# Patient Record
Sex: Male | Born: 1940 | Race: White | Hispanic: No | Marital: Married
Health system: Southern US, Community
[De-identification: ages and names within clinical notes are randomized; demographics above are authoritative.]

---

## 2012-02-15 ENCOUNTER — Emergency Department: Payer: Self-pay | Admitting: Emergency Medicine

## 2012-02-15 LAB — CBC
MCHC: 33.3 g/dL (ref 32.0–36.0)
Platelet: 213 10*3/uL (ref 150–440)
RDW: 13.8 % (ref 11.5–14.5)
WBC: 11 10*3/uL — ABNORMAL HIGH (ref 3.8–10.6)

## 2012-02-15 LAB — URINALYSIS, COMPLETE
Glucose,UR: NEGATIVE mg/dL (ref 0–75)
Protein: 30
RBC,UR: 120 /HPF (ref 0–5)

## 2012-02-15 LAB — COMPREHENSIVE METABOLIC PANEL
Alkaline Phosphatase: 59 U/L (ref 50–136)
Anion Gap: 7 (ref 7–16)
BUN: 19 mg/dL — ABNORMAL HIGH (ref 7–18)
Bilirubin,Total: 0.5 mg/dL (ref 0.2–1.0)
Co2: 24 mmol/L (ref 21–32)
Creatinine: 1.07 mg/dL (ref 0.60–1.30)
EGFR (African American): >60
EGFR (Non-African Amer.): >60
Osmolality: 275 (ref 275–301)
Potassium: 4.2 mmol/L (ref 3.5–5.1)
SGOT(AST): 24 U/L (ref 15–37)

## 2012-02-17 ENCOUNTER — Emergency Department: Payer: Self-pay | Admitting: Emergency Medicine

## 2012-02-17 LAB — BASIC METABOLIC PANEL
Calcium, Total: 9.6 mg/dL (ref 8.5–10.1)
Chloride: 106 mmol/L (ref 98–107)
Creatinine: 1.45 mg/dL — ABNORMAL HIGH (ref 0.60–1.30)
EGFR (African American): 56 — ABNORMAL LOW
EGFR (Non-African Amer.): 48 — ABNORMAL LOW
Glucose: 164 mg/dL — ABNORMAL HIGH (ref 65–99)
Potassium: 4 mmol/L (ref 3.5–5.1)
Sodium: 139 mmol/L (ref 136–145)

## 2012-02-17 LAB — CBC
HGB: 13.8 g/dL (ref 13.0–18.0)
MCH: 32.9 pg (ref 26.0–34.0)
RBC: 4.19 10*6/uL — ABNORMAL LOW (ref 4.40–5.90)
WBC: 10.1 10*3/uL (ref 3.8–10.6)

## 2012-02-17 LAB — URINALYSIS, COMPLETE
Bilirubin,UR: NEGATIVE
Blood: NEGATIVE
Glucose,UR: 50 mg/dL (ref 0–75)
Leukocyte Esterase: NEGATIVE
Nitrite: NEGATIVE
Ph: 9 (ref 4.5–8.0)
Protein: 30
Specific Gravity: 1.021 (ref 1.003–1.030)
WBC UR: 3 /HPF (ref 0–5)

## 2012-02-19 ENCOUNTER — Inpatient Hospital Stay: Payer: Self-pay | Admitting: Internal Medicine

## 2012-02-19 LAB — CK TOTAL AND CKMB (NOT AT ARMC)
CK, Total: 289 U/L — ABNORMAL HIGH (ref 35–232)
CK-MB: 6.8 ng/mL — ABNORMAL HIGH (ref 0.5–3.6)

## 2012-02-19 LAB — PRO B NATRIURETIC PEPTIDE: B-Type Natriuretic Peptide: 5934 pg/mL — ABNORMAL HIGH (ref 0–125)

## 2012-02-19 LAB — TROPONIN I: Troponin-I: 2.4 ng/mL — ABNORMAL HIGH

## 2012-02-19 LAB — COMPREHENSIVE METABOLIC PANEL
Albumin: 3.2 g/dL — ABNORMAL LOW (ref 3.4–5.0)
Anion Gap: 12 (ref 7–16)
Chloride: 102 mmol/L (ref 98–107)
Co2: 20 mmol/L — ABNORMAL LOW (ref 21–32)
EGFR (Non-African Amer.): 47 — ABNORMAL LOW
Osmolality: 281 (ref 275–301)
Potassium: 3.8 mmol/L (ref 3.5–5.1)
SGOT(AST): 41 U/L — ABNORMAL HIGH (ref 15–37)
Sodium: 134 mmol/L — ABNORMAL LOW (ref 136–145)

## 2012-02-19 LAB — CBC
HCT: 36.9 % — ABNORMAL LOW (ref 40.0–52.0)
HGB: 12.7 g/dL — ABNORMAL LOW (ref 13.0–18.0)
MCV: 96 fL (ref 80–100)
Platelet: 178 10*3/uL (ref 150–440)
RBC: 3.85 10*6/uL — ABNORMAL LOW (ref 4.40–5.90)

## 2012-02-19 LAB — PROTIME-INR
INR: 1.1
Prothrombin Time: 14.2 secs (ref 11.5–14.7)

## 2012-02-19 LAB — APTT: Activated PTT: 32.6 secs (ref 23.6–35.9)

## 2012-03-01 DEATH — deceased

## 2013-06-27 IMAGING — CT CT STONE STUDY
1 of 2 series · 15 of 32 positions shown, 19 images · non-contrast
Comparison: none

REASON FOR EXAM: right flank pain eval stone
COMMENTS:

PROCEDURE:     CT  - CT ABDOMEN /PELVIS WO (STONE)  - February 15, 2012 [DATE]
RESULT:     CT abdomen and pelvis dated 02/15/2012
TECHNIQUE: Helical noncontrasted 3 mm sections were obtained from the lung
bases to the pubic symphysis. Hypoventilation is appreciated within the lung
bases.

[Series 2: 3mm soft tissue · axial · 0.70mm/px · z∈[-1078,-622]mm · 15 of 168 slices shown, 19 images]
[im 8/168  soft-tissue]
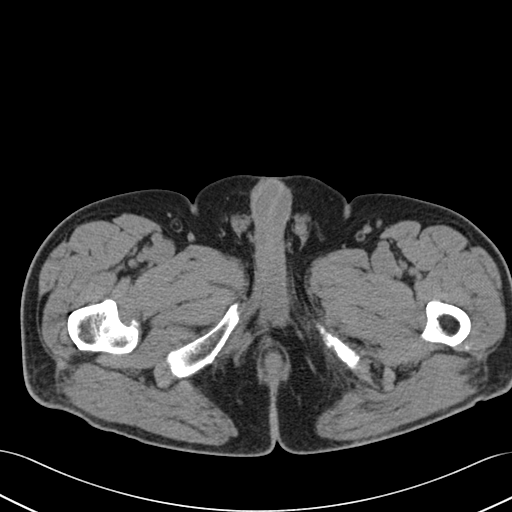
[im 8/168  bone]
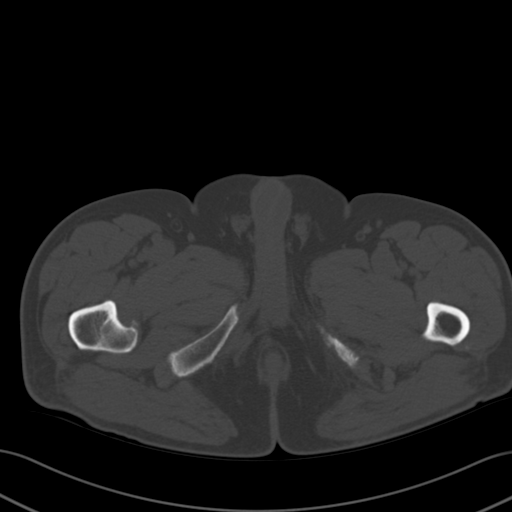
[im 22/168  soft-tissue]
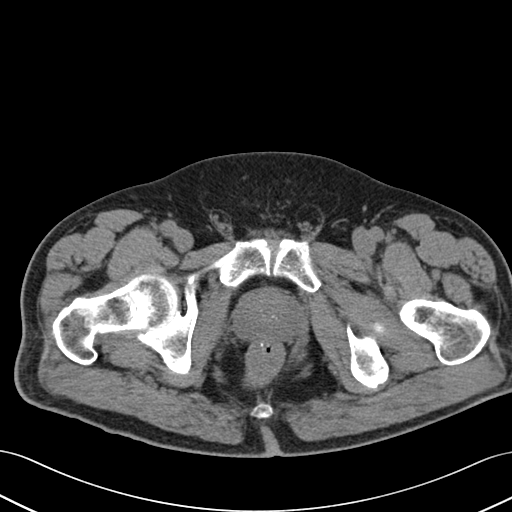
[im 37/168  soft-tissue]
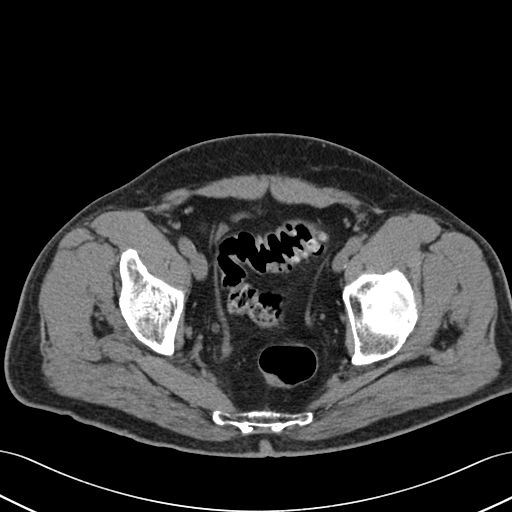
[im 44/168  soft-tissue]
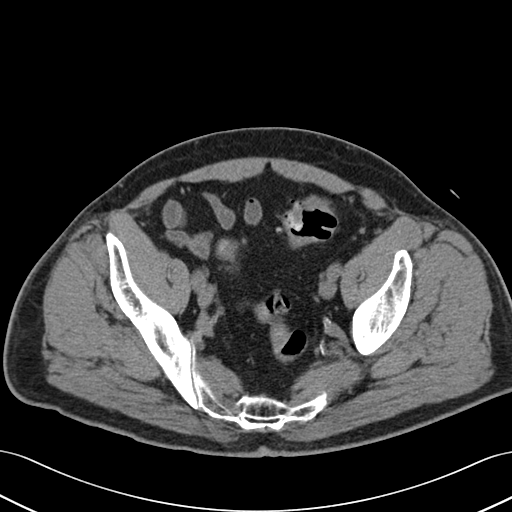
[im 59/168  soft-tissue]
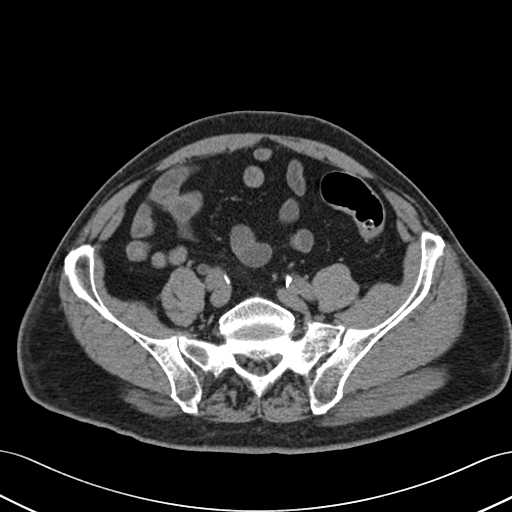
[im 73/168  soft-tissue]
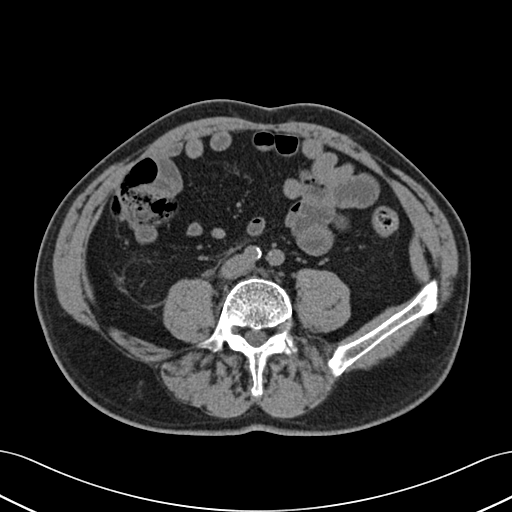
[im 88/168  soft-tissue]
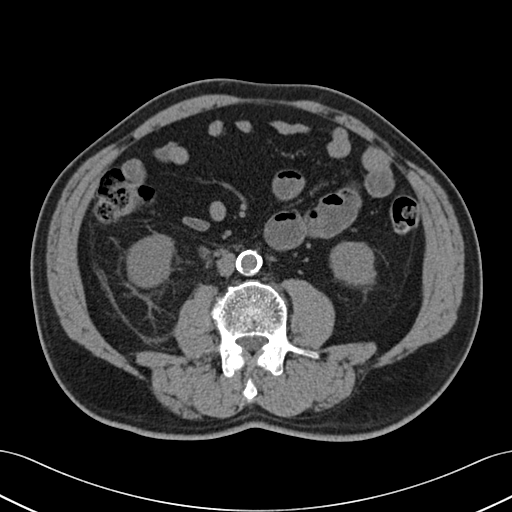
[im 95/168  soft-tissue]
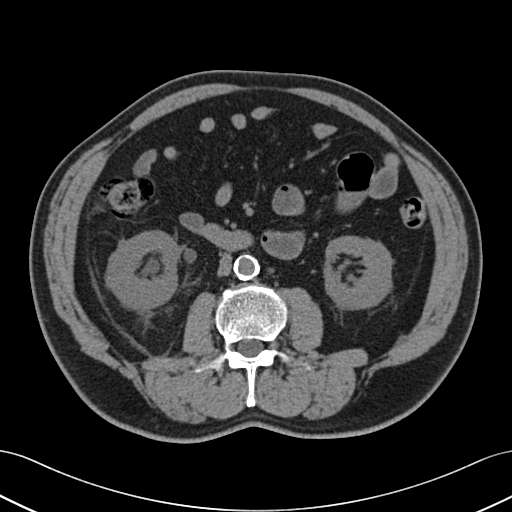
[im 109/168  soft-tissue]
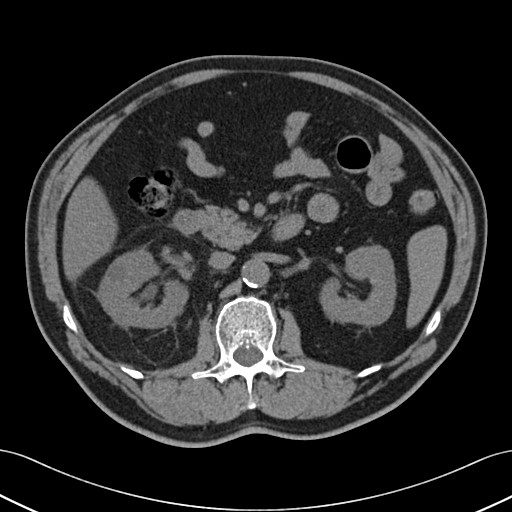
[im 109/168  bone]
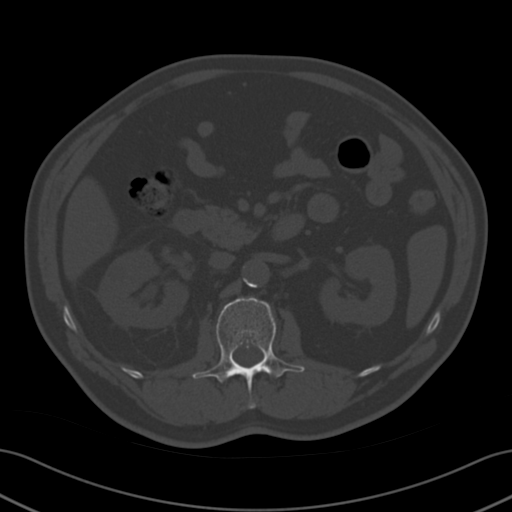
[im 124/168  soft-tissue]
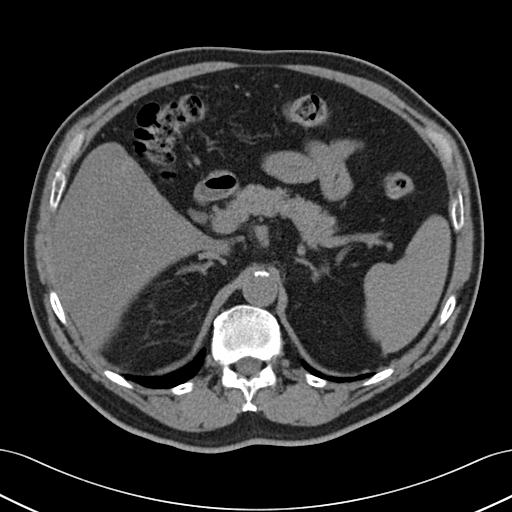
[im 131/168  soft-tissue]
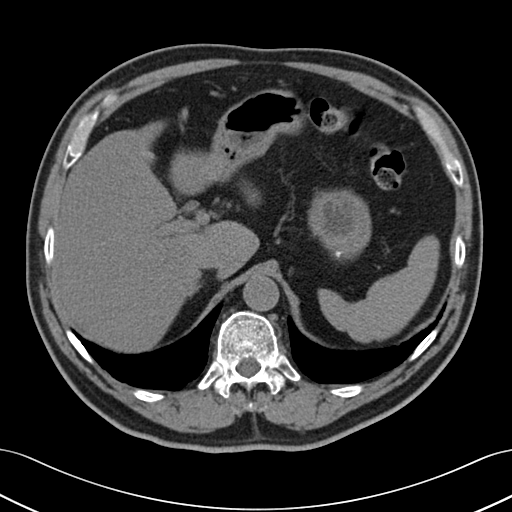
[im 138/168  lung]
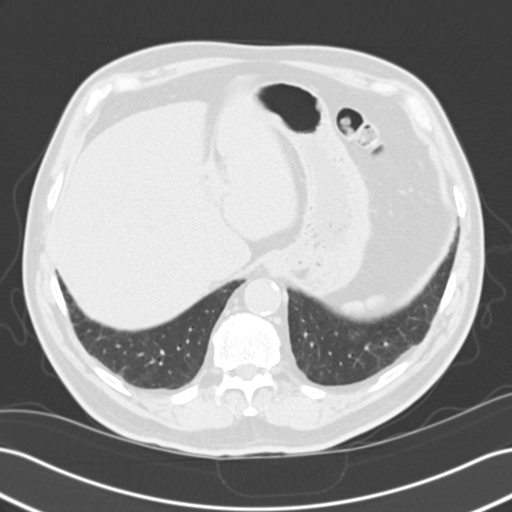
[im 146/168  soft-tissue]
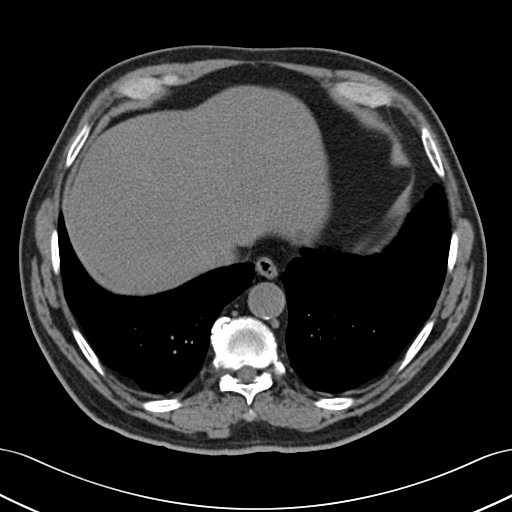
[im 146/168  lung]
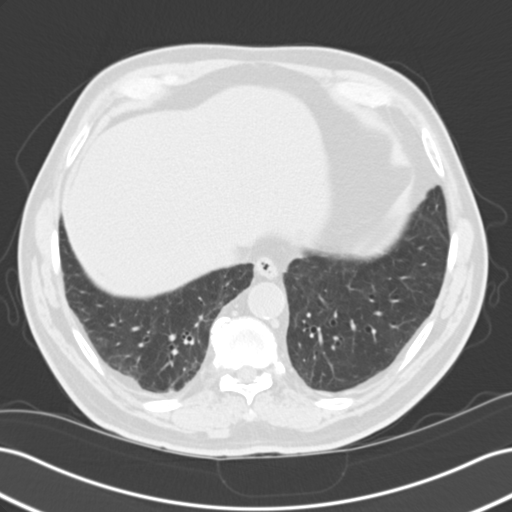
[im 153/168  lung]
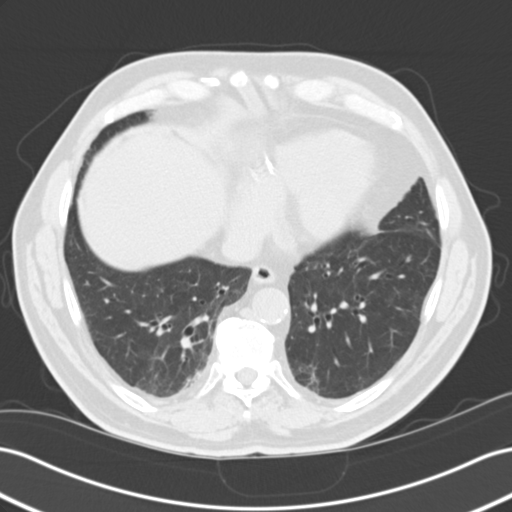
[im 160/168  soft-tissue]
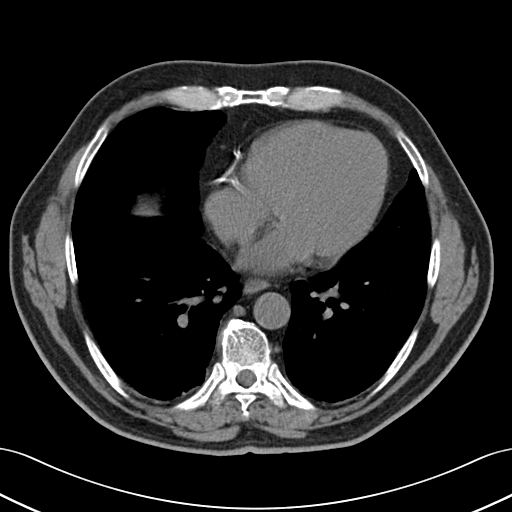
[im 160/168  lung]
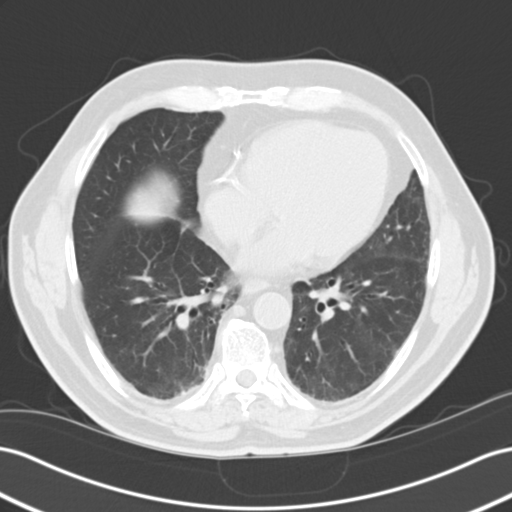

[15 of 32 positions shown; findings below may reference images not displayed]

Noncontrast evaluation of the liver, spleen, adrenals, pancreas is
unremarkable. Patient status post cholecystectomy.

Evaluation of the right kidney demonstrates mild stranding within the
perinephric fat. There is mild hydronephrosis secondary to a 3.5 millimeter
calculus within the distal left ureter. Mild hydroureter is appreciated. The
left kidney demonstrates a small nonobstructing medullary calculus without
evidence for hydronephrosis, hydroureter, nephrolithiasis nor
ureterolithiasis. There is no evidence of bowel obstruction, enteritis,
colitis nor diverticulitis. Diverticulosis is appreciated within the sigmoid
colon.
IMPRESSION: Mild right obstructive uropathy secondary to a distal
ureteral calculus common 3.5 mm.
2. Diverticulosis sigmoid colon
3. Dr. Tiger of the emergency department was informed of these findings
via a preliminary faxed report.

## 2013-07-01 IMAGING — CR DG CHEST 2V
1 series · 2 of 2 positions shown · non-contrast
Comparison: none

REASON FOR EXAM: Shortness of Breath
COMMENTS:   May transport without cardiac monitor

[Series 1: w chest pa · 0.14mm/px · 2 of 2 slices shown]
[im 1/2]
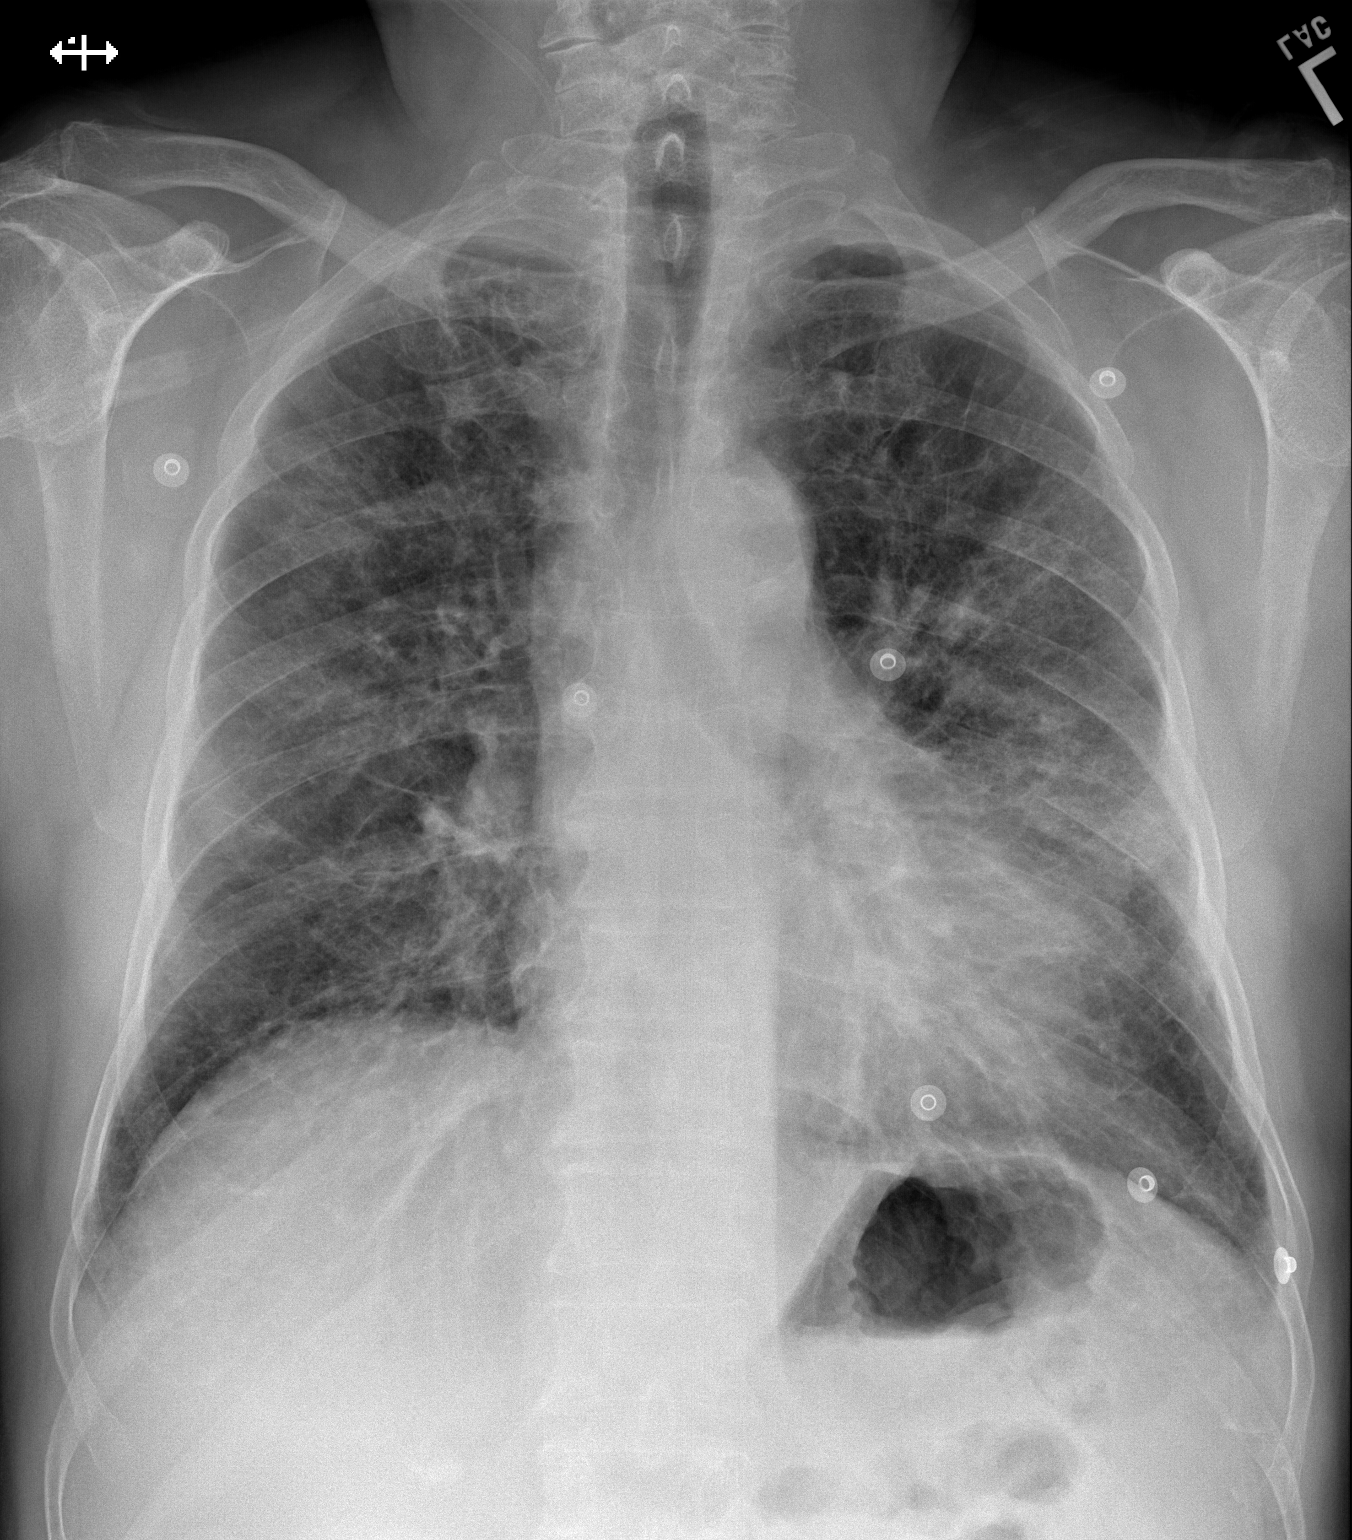
[im 2/2]
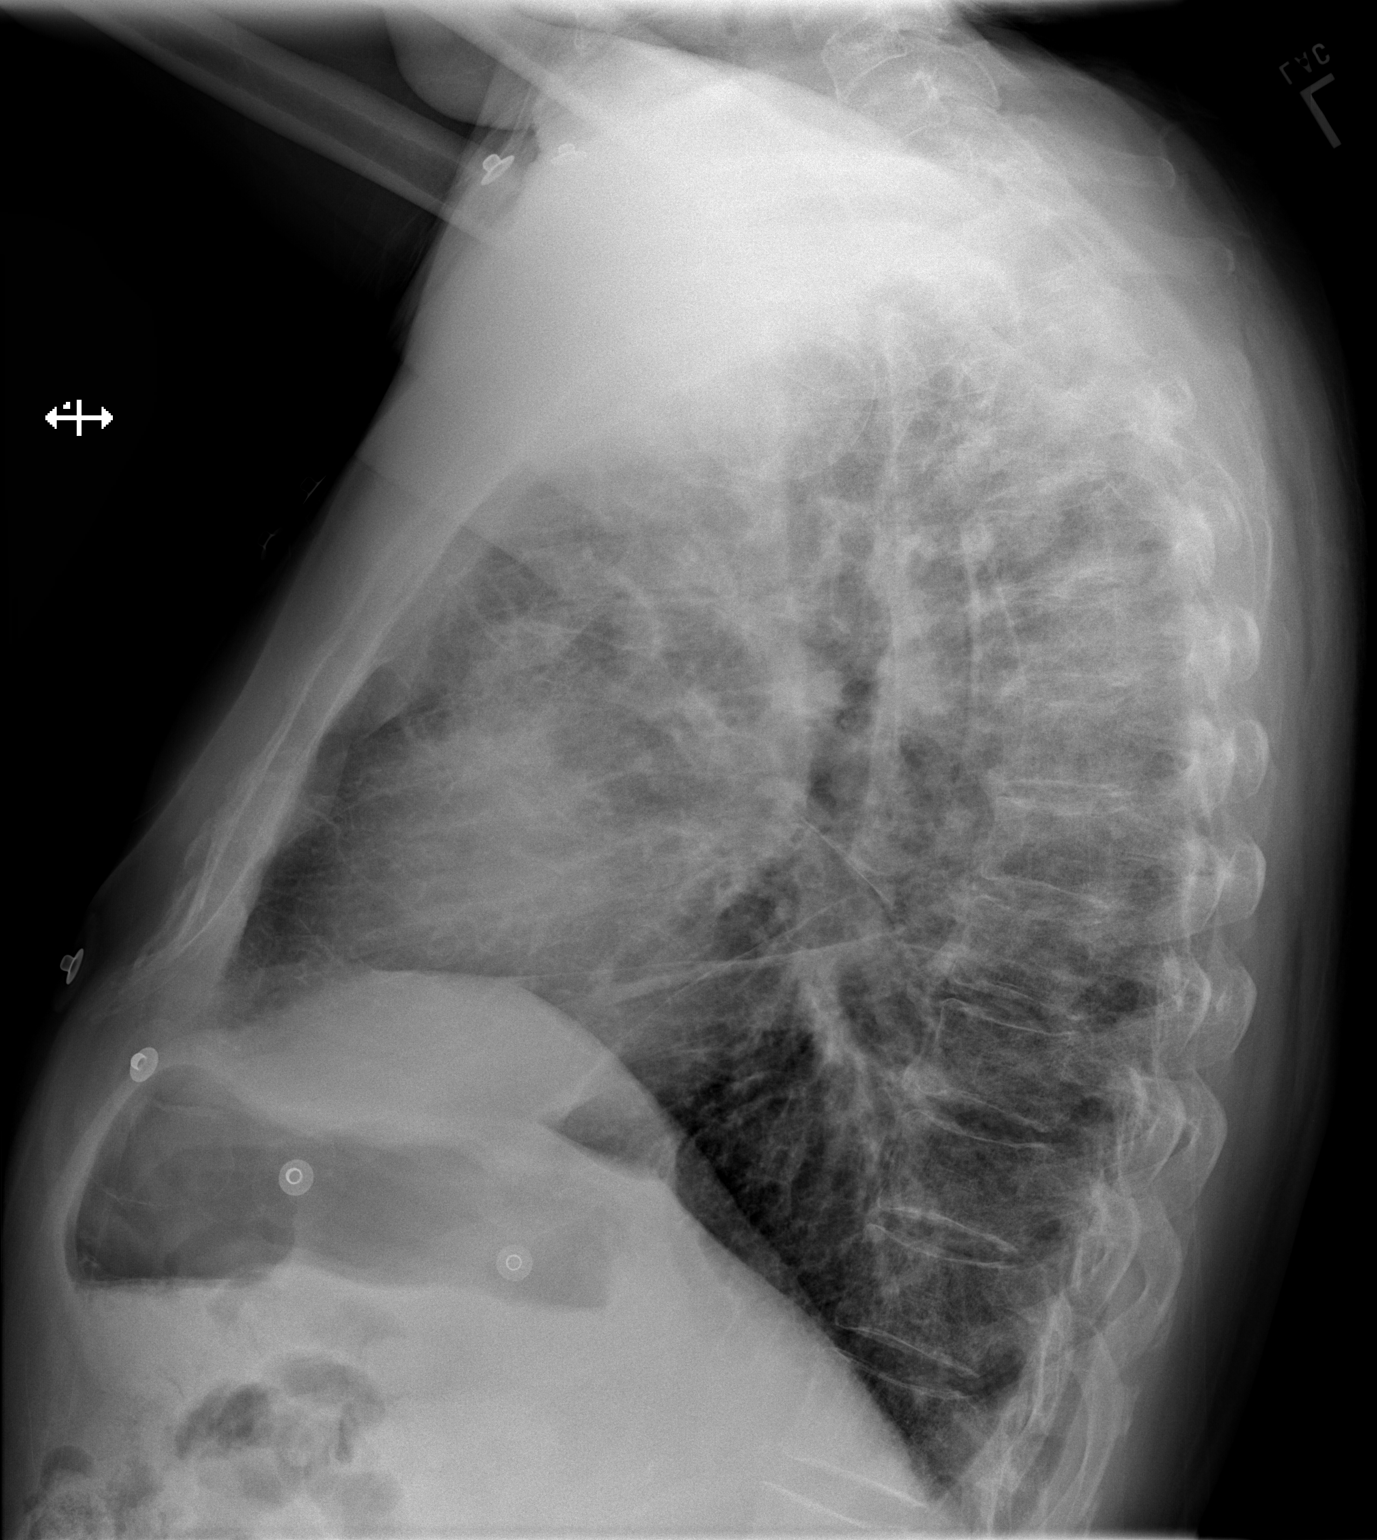

[2 of 2 positions shown; findings below may reference images not displayed]

PROCEDURE:     DXR - DXR CHEST PA (OR AP) AND LATERAL  - February 19, 2012  [DATE]

RESULT:     The lungs are abnormal with diffuse heterogeneous interstitial
density. There is some atelectasis in the lingula with poor visualization of
the left heart border. There is no significant effusion or pneumothorax.
Differential considerations diffuse bilateral pneumonia, asymmetric
heterogeneous edema or metastatic disease. The heart is mildly enlarged.
IMPRESSION: Please see above.

[REDACTED]

## 2014-04-23 NOTE — Consult Note (Signed)
PATIENT NAME:  Billy Cook, Billy Cook MR#:  [id number] DATE OF BIRTH:  [birth year]  DATE OF CONSULTATION:  02/19/2012  REFERRING PHYSICIAN:   CONSULTING PHYSICIAN:  Laurier NancyShaukat A. Koren Plyler, MD  HISTORY: This is a 74 year old white male with a past medical history of hypertension, diabetes and hypercholesterolemia. Had a stress test several years ago that was negative, in my office, but never came for followup. Has been having chest pain for the past 2 to 3 days, on and off. This morning he woke up with burning-type pain in the chest associated with diaphoresis and shortness of breath. His breathing was labored. His wife called 911 and then came to the Emergency Room. He was given nitroglycerin twice and then chest pain went away. His EKG had minor ST elevation thus I was asked to evaluate the patient. He denies any chest pain right now. He says this has been going on for 2 to 3 days, off and on.  PAST MEDICAL HISTORY: History of hypertension, hyperlipidemia and diabetes.   SOCIAL HISTORY: Denies EtOH abuse or smoking. He used to smoke in the past.   FAMILY HISTORY: Positive for coronary artery disease.   PHYSICAL EXAMINATION: VITAL SIGNS: Stable.  NECK: No JVD.  LUNGS: Good air entry.  HEART: Regular rate, rhythm. Normal S1 and S2. Tachycardic. No murmur.  ABDOMEN: Soft and nontender, positive bowel sounds.  EXTREMITIES: No pedal edema.  NEUROLOGIC: The patient appears to be intact.   LAB RESULTS: EKG shows normal sinus rhythm, sinus tachycardia, poor R wave progression suggestive of anteroseptal wall MI. Troponin is 2.4. White count is 9.9, H and H is 12.7 and 36.7. CPK is 2.89. MB fraction is 6.8.      ASSESSMENT AND PLAN: Non-ST-segment elevation myocardial infarction. Plan is to do left heart catheterization. Will start the patient on Integrilin, nitro paste, aspirin and metoprolol, and will do left heart catheterization today. Thank you very much for the referral.    ____________________________ Laurier NancyShaukat A. Ying Blankenhorn, MD sak:sb D: 02/19/2012 08:00:06 ET T: 02/19/2012 08:13:48 ET JOB#: [id number]  cc: Laurier NancyShaukat A. Shaneta Cervenka, MD, <Dictator> Laurier NancySHAUKAT A Naomie Crow MD ELECTRONICALLY SIGNED 03/25/2012 9:04

## 2014-04-23 NOTE — Discharge Summary (Signed)
PATIENT NAME:  Billy Cook, Billy Cook MR#:  [id number] DATE OF BIRTH:  October 19, 1940  DATE OF ADMISSION:  02/19/2012  DATE OF DISCHARGE:  02/19/2012  DISCHARGE DIAGNOSES:   1.  Coronary artery disease.  2.  Non-ST elevation myocardial infarction. 3.  Hypertension.    4.  Diabetes.  5.  Hyperlipidemia.  6.  Acute renal failure.  7.  Questionable infiltrate on chest x-ray.   PRIMARY CARE PHYSICIAN:  None local.   HISTORY OF PRESENTING ILLNESS:  A 74 year old male with past medical history of hypertension, diabetes, hyperlipidemia, and recent kidney stone was in usual state of health without any complaints.  Around 1:00 to 2:00 a.m., he woke up with severe chest pain, which was substernal, crushing type.  He received 4 tablets of aspirin at home by his wife and called EMS. EMS gave him nitroglycerin sublingual and then finally his pain relieved. He did  not have any EKG changes in the ER, but his troponin was found to be more than 2, and that is why ER physician called Dr. Adrian BlackwaterShaukat Khan, the cardiologist. They took the patient directly to the cardiac cath lab. Cardiac cath was done and which was reported as following:  Severe three-vessel coronary artery disease with high-grade lesion of left main and normal LVEF. [facility] Welton [facility]Khan [facility], and immediately arranged the [facility].   OTHER MEDICAL ISSUES ADDRESSED DURING THE HOSPITAL STAY: As mentioned above:  1.  Hypertension: We continued home medication, beta blocker, monitored his blood pressure.   2.  Diabetes: We advised to monitor on insulin sliding, as he was going for cardiac cath, but there was not enough time to monitor anything.  3.  Hyperlipidemia:  Plan to continue lovastatin.  Check his lipid panel next day morning, but there was no time to continue anything.  4.  Acute renal failure due to kidney  stone. His renal function  was normal before last few days. I spoke with Dr. Cherylann RatelLateef on the phone and asked further management to prevent the damage.  He advised to continue a bolus of normal saline and then give Mucomyst- started  normal saline at higher rate and Mucomyst 1200 mg oral tablet 2 times a day.  5.  Questionable infiltrate on chest x-ray.  Pt had Dry cough,no fever, but chest x-ray infiltrates- started on Rocephin and Zithromax by ER- and I continued.  IMPORTANT LABORATORY DATA:  Glucose 266, BUN 21, creatinine 1.5  BNP was 5934 on admission.  Troponin was 2.40.  Xray chest-  bilateral pneumonia can not be ruled out.  DISCHARGE INSTRUCTIONS:  As the patient was directly transferred to the cardiac cath lab,and [facility]. the patient left the hospital before I could see the patient for Post cardiac cath finding discussion. Marland Kitchen.    DISCHARGE MEDICATIONS: As the patient was discharged immediately from Cardiac cath- not any advised given by Me for continuation. Cardiologist transfered him on integrilin drip.    ____________________________ Hope PigeonVaibhavkumar G. Elisabeth PigeonVachhani, MD vgv:dm D: 02/21/2012 22:00:00 ET T: 02/22/2012 09:47:12 ET JOB#: [id number]  cc: Hope PigeonVaibhavkumar G. Elisabeth PigeonVachhani, MD, <Dictator> Altamese DillingVAIBHAVKUMAR Enyla Lisbon MD ELECTRONICALLY SIGNED 02/22/2012 20:03

## 2014-04-23 NOTE — H&P (Signed)
PATIENT NAME:  Billy Cook, Billy Cook MR#:  [id number] DATE OF BIRTH:  [birth year]  DATE OF ADMISSION:  02/19/2012  PRIMARY CARE PHYSICIAN: Nonlocal.   REFERRING ER PHYSICIAN:  Lowella FairyJohn Woodruff, MD  CARDIOLOGIST: Adrian BlackwaterShaukat Khan, MD   CHIEF COMPLAINT: Chest pain.   HISTORY OF PRESENT ILLNESS: The patient is a 74 year old male with past medical history of hypertension, diabetes, hyperlipidemia and recent kidney stone who was in his usual state of health without any complaints. Today around 1:00 to 2:00 a.m. he woke up with severe chest pain which was substernal and crushing type of pain that woke him up from sleep and he was unable to get relief of the pain. He said that the pain was like 8 to 9 out of 10. His wife gave him 4 tablets of aspirin and called EMS. When EMS gave him nitroglycerin, his pain relieved, and in the Emergency Room his troponin was found to be more than 2. There are some EKG changes and so ER physician called Dr. Adrian BlackwaterShaukat Khan, cardiologist. He saw the patient and planning to do cardiac cath immediately. On further questioning, the patient denies any similar episodes in the past. He was leading active life and there are no restrictions from activity point of view. Never had similar pain. He says that he presented to the Emergency Room on 14th of February and then on 16th of February because of pain due to kidney stone. He was sent home with pain medication and Zofran and he felt better so he feels that maybe he might have passed a stone. He has complaint of cough for the last 1 to 2 days, but denies any fever or sputum production.   REVIEW OF SYSTEMS:  CONSTITUTIONAL: No fever, fatigue, weakness or weight loss.  EYES: No blurring or double vision. EARS: No tinnitus, ringing or pain in the ears. RESPIRATORY: Has some cough but no sputum. No shortness of breath.  CARDIOVASCULAR: Had chest pain but denies any palpitations, dizziness or edema on the legs.  GASTROINTESTINAL: He had nausea and  vomiting 3 to 4 days ago because of his kidney stone pain, but that is resolved now. Currently there is no complaint.  GENITOURINARY: No increased frequency or burning in the urination.  ENDOCRINE: No heat or cold intolerance.  JOINTS: No swelling or pain.  NEUROLOGICAL: No numbness, weakness, tremors or headaches.  PSYCHIATRIC: No insomnia, depression or bipolar.   PAST MEDICAL HISTORY: Positive for diabetes, hypertension and hyperlipidemia.  HOME MEDICATIONS: Percocet 1 tablet every 6 hours for pain, atenolol 100 mg oral tablet once a day, enalapril 20 mg oral tablet 2 tablets once a day, Gas-X extra-strength chewable tablet 2 tablets 4 times a day as needed, hydrochlorothiazide 25 mg oral tablet once a day, lovastatin 40 mg 2 tablets once a day, metformin 500 mg 2 times a day, ondansetron 4 mg oral tablet 3 times a day, tamsulosin 0.4 mg oral capsule once a day, Zantac 150 mg oral tablet once a day.   PAST SURGICAL HISTORY: None.   ALLERGIES: No known drug allergies.  SOCIAL HISTORY: He lives with the family. 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   FAMILY HISTORY: Father had myocardial infarction and he died because of that at age of 73 years. Mother is still alive at 993 years and does not have any major medical problems.  LABS AND DIAGNOSTICS: Glucose 266. BNP 5934. BUN 21. Creatinine 1.50; on 14th of February, creatinine was 1.07. Sodium 134, potassium 3.8,  chloride 102, CO2 20 and calcium 8.7. CK total 289, CK-MB 6.8 and troponin 2.4. WBC 9.9, hemoglobin 12.7, platelet count 178 and MCV 96. INR 1.1. Urinalysis was positive 4 days ago with presence of blood which resolved on 16th of February. EKG: T wave inversion in lead III and V1 to V3. There is slight 1 mm ST elevation in leads V1 and V2.   PHYSICAL EXAMINATION:  VITAL SIGNS: In the ER temperature was 98.5, pulse rate 100, respirations 20, blood pressure 118/70 and pulse ox 90% on room air.  GENERAL: He  is alert and oriented to time, place and person and does not appear in any acute distress.  HEENT: Head and neck atraumatic. Conjunctivae pink. Oral mucosa moist.  NECK: Supple. No JVD. RESPIRATORY: Bilateral clear and equal air entry.  CARDIOVASCULAR: S1 and S2 present, regular. No murmur appreciated. No local tenderness on chest pressure.  ABDOMEN: Soft, nontender. Bowel sounds present. No organomegaly.  LEGS: No edema.  SKIN: No rashes.  NEUROLOGICAL: Power 5 out of 5 all 4 limbs, grossly no acute abnormalities.  PSYCHIATRIC: Does not appear in any psychiatric illness, grossly.   ASSESSMENT AND PLAN: A 74 year old male with hypertension, diabetes and hyperlipidemia who came in with chest pain and troponin is positive with minimal EKG changes.  1. Non-ST-elevation myocardial infarction. Dr. Welton Flakes made aware by ER physician. He saw the patient and he is taking the patient for cardiac cath. He ordered Integrilin and heparin drip. Aspirin and nitroglycerin received while in ER and pain is resolved at this point. We will continue his beta blocker and statin and follow the cardiac cath results.  2. Hypertension. We will continue beta blocker and monitor his blood pressure.  3. Diabetes. We will put him on insulin sliding scale at this time as he is going for cardiac cath.  4. Hyperlipidemia. We will continue lovastatin and check lipid panel tomorrow morning.  5. Acute renal failure. This is due to recent kidney stone. His renal function was normal on 14th of February. I spoke to Dr. Cherylann Ratel on the phone to ask his advice and I am giving 1 liter of bolus normal saline and then we will continue 200 mL per hour. I will give Mucomyst 1200 mg oral tablets b.i.d. and we will monitor his kidney function  6. Questionable infiltrate on chest x-ray. The patient has complaint of cough, no sputum production and no fever, but chest x-ray, on the left lower lobe, there is questionable infiltrate present. ER physician  started on Rocephin. We will continue that for now.  7. Gastrointestinal prophylaxis with Nexium IV daily.  CODE STATUS: FULL CODE.   Plan explained to the patient's wife, son and daughter-in-law who were present in the room.  TOTAL TIME SPENT: One hour. ____________________________ Billy Cook. Billy Pigeon, MD vgv:sb D: 02/19/2012 08:51:07 ET    T: 02/19/2012 10:52:16 ET        JOB#: [id number] cc: Billy Cook. Billy Pigeon, MD, <Dictator> Altamese Dilling MD ELECTRONICALLY SIGNED 02/22/2012 19:48
